# Patient Record
Sex: Male | Born: 1990 | Race: Black or African American | Hispanic: No | Marital: Single | Smoking: Current some day smoker
Health system: Southern US, Community
[De-identification: ages and names within clinical notes are randomized; demographics above are authoritative.]

## PROBLEM LIST (undated history)

## (undated) DIAGNOSIS — U071 COVID-19: Secondary | ICD-10-CM

## (undated) DIAGNOSIS — K649 Unspecified hemorrhoids: Secondary | ICD-10-CM

## (undated) DIAGNOSIS — J45909 Unspecified asthma, uncomplicated: Secondary | ICD-10-CM

## (undated) DIAGNOSIS — F419 Anxiety disorder, unspecified: Secondary | ICD-10-CM

---
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
## 2014-02-16 ENCOUNTER — Emergency Department (HOSPITAL_COMMUNITY): Payer: No Typology Code available for payment source

## 2014-02-16 ENCOUNTER — Encounter (HOSPITAL_COMMUNITY): Payer: Self-pay | Admitting: Family Medicine

## 2014-02-16 ENCOUNTER — Emergency Department (HOSPITAL_COMMUNITY)
Admission: EM | Admit: 2014-02-16 | Discharge: 2014-02-16 | Disposition: A | Discharge status: Home / Self Care | Payer: No Typology Code available for payment source | Attending: Emergency Medicine | Admitting: Emergency Medicine

## 2014-02-16 DIAGNOSIS — Y9389 Activity, other specified: Secondary | ICD-10-CM | POA: Insufficient documentation

## 2014-02-16 DIAGNOSIS — Z720 Tobacco use: Secondary | ICD-10-CM | POA: Insufficient documentation

## 2014-02-16 DIAGNOSIS — Z23 Encounter for immunization: Secondary | ICD-10-CM | POA: Insufficient documentation

## 2014-02-16 DIAGNOSIS — S61219A Laceration without foreign body of unspecified finger without damage to nail, initial encounter: Secondary | ICD-10-CM

## 2014-02-16 DIAGNOSIS — Y998 Other external cause status: Secondary | ICD-10-CM | POA: Insufficient documentation

## 2014-02-16 DIAGNOSIS — M25531 Pain in right wrist: Secondary | ICD-10-CM

## 2014-02-16 DIAGNOSIS — J45909 Unspecified asthma, uncomplicated: Secondary | ICD-10-CM | POA: Insufficient documentation

## 2014-02-16 DIAGNOSIS — S61212A Laceration without foreign body of right middle finger without damage to nail, initial encounter: Secondary | ICD-10-CM | POA: Insufficient documentation

## 2014-02-16 DIAGNOSIS — Y92410 Unspecified street and highway as the place of occurrence of the external cause: Secondary | ICD-10-CM | POA: Insufficient documentation

## 2014-02-16 HISTORY — DX: Unspecified asthma, uncomplicated: J45.909

## 2014-02-16 MED ORDER — LIDOCAINE HCL (PF) 1 % IJ SOLN
5.0000 mL | Freq: Once | INTRAMUSCULAR | Status: AC
  Administered 2014-02-16: 5 mL
  Filled 2014-02-16: qty 5

## 2014-02-16 MED ORDER — TETANUS-DIPHTH-ACELL PERTUSSIS 5-2.5-18.5 LF-MCG/0.5 IM SUSP
0.5000 mL | Freq: Once | INTRAMUSCULAR | Status: AC
  Administered 2014-02-16: 0.5 mL via INTRAMUSCULAR
  Filled 2014-02-16: qty 0.5

## 2014-02-16 MED ORDER — KETOROLAC TROMETHAMINE 10 MG PO TABS: 10.0000 mg | ORAL_TABLET | Freq: Four times a day (QID) | ORAL | Status: DC | PRN
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
## 2014-02-16 NOTE — Discharge Instructions (Signed)
Return to the emergency room with worsening of symptoms, new symptoms or with symptoms that are concerning , especially severe worsening of headache, visual or speech changes, weakness in face, arms or legs. RICE: Rest, Ice (three cycles of 20 mins on, 20mins off at least twice a day), compression/brace, elevation. Heating pad works well for back pain. Ibuprofen 400mg  (2 tablets 200mg ) every 5-6 hours for 3-5 days and then as needed for pain. Follow up with PCP/orthopedist if symptoms worsen or are persistent.  Keep wound dry and do not remove dressing for 24 hours if possible. After that, wash gently morning and night (every 12 hours) with soap and water. Use a topical antibiotic ointment and cover with a bandaid or gauze.    Do NOT use rubbing alcohol or hydrogen peroxide, do not soak the area   Present to your primary care doctor or the urgent care of your choice, or the ED for suture removal in 7-10 days.   Every attempt was made to remove foreign body (contaminants) from the wound.  However, there is always a chance that some may remain in the wound. This can  increase your risk of infection.   If you see signs of infection (warmth, redness, tenderness, pus, sharp increase in pain, fever, red streaking in the skin) immediately return to the emergency department.   After the wound heals fully, apply sunscreen for 6-12 months to minimize scarring.

## 2014-02-16 NOTE — ED Notes (Signed)
Declined W/C at D/C and was escorted to lobby by RN. 

## 2016-11-07 ENCOUNTER — Encounter: Payer: Self-pay | Admitting: Emergency Medicine

## 2016-11-07 ENCOUNTER — Emergency Department
Admission: EM | Admit: 2016-11-07 | Discharge: 2016-11-07 | Disposition: A | Discharge status: Home / Self Care | Payer: Self-pay | Attending: Emergency Medicine | Admitting: Emergency Medicine

## 2016-11-07 ENCOUNTER — Emergency Department: Payer: Self-pay

## 2016-11-07 DIAGNOSIS — Y999 Unspecified external cause status: Secondary | ICD-10-CM | POA: Insufficient documentation

## 2016-11-07 DIAGNOSIS — Y93A9 Activity, other involving cardiorespiratory exercise: Secondary | ICD-10-CM | POA: Insufficient documentation

## 2016-11-07 DIAGNOSIS — Y92838 Other recreation area as the place of occurrence of the external cause: Secondary | ICD-10-CM | POA: Insufficient documentation

## 2016-11-07 DIAGNOSIS — X509XXA Other and unspecified overexertion or strenuous movements or postures, initial encounter: Secondary | ICD-10-CM | POA: Insufficient documentation

## 2016-11-07 DIAGNOSIS — S83422A Sprain of lateral collateral ligament of left knee, initial encounter: Secondary | ICD-10-CM | POA: Insufficient documentation

## 2016-11-07 DIAGNOSIS — F17210 Nicotine dependence, cigarettes, uncomplicated: Secondary | ICD-10-CM | POA: Insufficient documentation

## 2016-11-07 DIAGNOSIS — J45909 Unspecified asthma, uncomplicated: Secondary | ICD-10-CM | POA: Insufficient documentation

## 2016-11-07 NOTE — Discharge Instructions (Signed)
Your exam and x-ray are negative at this time. Your exam is consistent with a knee sprain. Wear the knee wrap as needed for support. Rest with the foot elevated when seated. Take OTC ibuprofen or naproxen for pain relief. Follow-up with ortho as needed.

## 2016-11-07 NOTE — ED Notes (Signed)
Ace wrap applied to left knee. Pt given instructions on loosening should it get to tight due to swelling. Pt stated at this time it feels good and not to tight.

## 2016-11-07 NOTE — ED Notes (Signed)
See triage note  Presents with pain to left knee this am  States he did not fall onto knee but developed pain after sparing at the gym  No deformity noted

## 2016-11-07 NOTE — ED Provider Notes (Signed)
[facility] Emergency Department Provider Note ____________________________________________  Time seen: 1012  I have reviewed the triage vital signs and the nursing notes.  HISTORY  Chief Complaint  Knee Pain  HPI Randall Phelps is a 26 y.o. male presents to the ED for evaluation of left knee pain after injuring himself during of sparring exercise at the gym.He reports sustaining what he describes as a varus stress to the left knee. He denies any immediate swelling and pain, he did note some lateral knee joint pain as well as some limping after the injury. Has applied some ice but no other interventions have been provided. He denies any history of chronic or ongoing knee pain. He presents today for evaluation of a knee sprain on the left.  Past Medical History:  Diagnosis Date  . Asthma     There are no active problems to display for this patient.   History reviewed. No pertinent surgical history.  Prior to Admission medications   Medication Sig Start Date End Date Taking? Authorizing Provider  ketorolac (TORADOL) 10 MG tablet Take 1 tablet (10 mg total) by mouth every 6 (six) hours as needed. 02/16/14   Oswaldo Conroyreech, Victoria, PA-C    Allergies Patient has no known allergies.  No family history on file.  Social History Social History  Substance Use Topics  . Smoking status: Current Every Day Smoker  . Smokeless tobacco: Never Used  . Alcohol use Yes    Review of Systems  Constitutional: Negative for fever. Cardiovascular: Negative for chest pain. Respiratory: Negative for shortness of breath. Musculoskeletal: Negative for back pain. Left knee pain as above. Neurological: Negative for headaches, focal weakness or numbness. ____________________________________________  PHYSICAL EXAM:  VITAL SIGNS: ED Triage Vitals  Enc Vitals Group     BP 11/07/16 0937 129/86     Pulse Rate 11/07/16 0937 86     Resp 11/07/16 0937 18     Temp 11/07/16 0937  98.3 F (36.8 C)     Temp Source 11/07/16 0937 Oral     SpO2 11/07/16 0937 99 %     Weight 11/07/16 0938 160 lb (72.6 kg)     Height 11/07/16 0938 6\' 3"  (1.905 m)     Head Circumference --      Peak Flow --      Pain Score 11/07/16 0933 8     Pain Loc --      Pain Edu? --      Excl. in GC? --     Constitutional: Alert and oriented. Well appearing and in no distress. Head: Normocephalic and atraumatic. Cardiovascular:  Normal distal pulses. Respiratory: Normal respiratory effort.  Musculoskeletal: Left knee without any obvious deformity, dislocation, or joint effusion. Patient is to palpation to the lateral knee joint at the collateral ligament. There is increased pain with valgus stress to the joint. Negative anterior posterior drawer. No medical popliteal space fullness or tenderness. The patella tracks normally without ballottement or laxity. Nontender with normal range of motion in all extremities.  Neurologic:  Antalgic gait without ataxia. Normal speech and language. No gross focal neurologic deficits are appreciated. Skin:  Skin is warm, dry and intact. No rash noted. ___________________________________________   RADIOLOGY  Left Knee  IMPRESSION: No acute finding by plain radiography  I, Lovell Nuttall, Charlesetta IvoryJenise V Bacon, personally viewed and evaluated these images (plain radiographs) as part of my medical decision making, as well as reviewing the written report by the radiologist. ____________________________________________  PROCEDURES  Ace bandage  ____________________________________________  INITIAL IMPRESSION / ASSESSMENT AND PLAN / ED COURSE  Patient ED evaluation of a left knee lateral collateral ligament sprain. He has an overall normal knee exam without significant signs of internal derangement. No effusion is noted. He will be placed in Ace bandage and given knee sprain instructions. He should ice, rest, elevate the knee as needed. He may dose over-the-counter  anti-inflammatories for pain and inflammation relief. He is given name for the increased symptoms persist longer than expected. Return precautions are reviewed. ____________________________________________  FINAL CLINICAL IMPRESSION(S) / ED DIAGNOSES  Final diagnoses:  Sprain of lateral collateral ligament of left knee, initial encounter      Lissa Hoard, PA-C 11/07/16 1853    Phineas Semen, MD 11/08/16 1501

## 2016-11-07 NOTE — ED Triage Notes (Signed)
Patient presents to the ED with left knee pain that began today after injuring his knee "sparing" at the gym.  Patient is in no obvious distress at this time.

## 2017-12-19 ENCOUNTER — Emergency Department (HOSPITAL_COMMUNITY): Admission: EM | Admit: 2017-12-19 | Discharge: 2017-12-19 | Discharge status: Absent without leave | Payer: Self-pay

## 2018-01-15 ENCOUNTER — Encounter: Payer: Self-pay | Admitting: Emergency Medicine

## 2018-01-15 ENCOUNTER — Emergency Department
Admission: EM | Admit: 2018-01-15 | Discharge: 2018-01-15 | Disposition: A | Discharge status: Home / Self Care | Payer: Self-pay | Attending: Emergency Medicine | Admitting: Emergency Medicine

## 2018-01-15 ENCOUNTER — Other Ambulatory Visit: Payer: Self-pay

## 2018-01-15 DIAGNOSIS — F17200 Nicotine dependence, unspecified, uncomplicated: Secondary | ICD-10-CM | POA: Insufficient documentation

## 2018-01-15 DIAGNOSIS — Y9289 Other specified places as the place of occurrence of the external cause: Secondary | ICD-10-CM | POA: Insufficient documentation

## 2018-01-15 DIAGNOSIS — Y990 Civilian activity done for income or pay: Secondary | ICD-10-CM | POA: Insufficient documentation

## 2018-01-15 DIAGNOSIS — S29012A Strain of muscle and tendon of back wall of thorax, initial encounter: Secondary | ICD-10-CM | POA: Insufficient documentation

## 2018-01-15 DIAGNOSIS — S239XXA Sprain of unspecified parts of thorax, initial encounter: Secondary | ICD-10-CM

## 2018-01-15 DIAGNOSIS — J45909 Unspecified asthma, uncomplicated: Secondary | ICD-10-CM | POA: Insufficient documentation

## 2018-01-15 DIAGNOSIS — Y33XXXA Other specified events, undetermined intent, initial encounter: Secondary | ICD-10-CM | POA: Insufficient documentation

## 2018-01-15 DIAGNOSIS — Y939 Activity, unspecified: Secondary | ICD-10-CM | POA: Insufficient documentation

## 2018-01-15 MED ORDER — CYCLOBENZAPRINE HCL 5 MG PO TABS: 5.0000 mg | ORAL_TABLET | Freq: Three times a day (TID) | ORAL | 0 refills | Status: DC | PRN

## 2018-01-15 MED ORDER — NAPROXEN 500 MG PO TABS: 500.0000 mg | ORAL_TABLET | Freq: Two times a day (BID) | ORAL | 0 refills | Status: AC

## 2018-01-15 NOTE — Discharge Instructions (Addendum)
Your exam is consistent with muscle strain of the upper back. Take the prescription meds as directed. Apply ice, moist heat, and/or BioFreeze for muscle pain relief. Follow-up with [facility] as needed.

## 2018-01-15 NOTE — ED Notes (Signed)
See triage note.Presents with upper back which started today  Denies any injury

## 2018-01-15 NOTE — ED Triage Notes (Signed)
Says upper back pain ssarted about 1230p today while at work.  Says he did not injury

## 2018-01-15 NOTE — ED Provider Notes (Signed)
[facility] Emergency Department Provider Note ____________________________________________  Time seen: 1542  I have reviewed the triage vital signs and the nursing notes.  HISTORY  Chief Complaint  Back Pain  HPI Randall Phelps is a 27 y.o. male who presents to the ED for evaluation of upper back pain, with onset this afternoon. He was at work when the pain began, at about 12:30 pm. He denies any injury, trauma, fall, chest pain, SOB, distal paresthesias. or diaphoresis. He denies any job-related injury, but notes his work activities include a lot of lifting overhead. He has not tried any alleviating measures. His pain is not influenced by UE ROM or breathing.   Past Medical History:  Diagnosis Date  . Asthma    There are no active problems to display for this patient.  History reviewed. No pertinent surgical history.  Prior to Admission medications   Medication Sig Start Date End Date Taking? Authorizing Provider  cyclobenzaprine (FLEXERIL) 5 MG tablet Take 1 tablet (5 mg total) by mouth 3 (three) times daily as needed for muscle spasms. 01/15/18   Eathen Budreau, Charlesetta Ivory, PA-C  naproxen (NAPROSYN) 500 MG tablet Take 1 tablet (500 mg total) by mouth 2 (two) times daily with a meal for 15 days. 01/15/18 01/30/18  Callaghan Laverdure, Charlesetta Ivory, PA-C   Allergies Patient has no known allergies.  No family history on file.  Social History Social History   Tobacco Use  . Smoking status: Current Every Day Smoker  . Smokeless tobacco: Never Used  Substance Use Topics  . Alcohol use: Yes  . Drug use: Not on file    Review of Systems  Constitutional: Negative for fever. Cardiovascular: Negative for chest pain. Respiratory: Negative for shortness of breath. Gastrointestinal: Negative for abdominal pain, vomiting and diarrhea. Genitourinary: Negative for dysuria. Musculoskeletal: Positive for upper back pain. Skin: Negative for rash. Neurological: Negative  for headaches, focal weakness or numbness. ____________________________________________  PHYSICAL EXAM:  VITAL SIGNS: ED Triage Vitals [01/15/18 1515]  Enc Vitals Group     BP 127/74     Pulse Rate 85     Resp 14     Temp 98.4 F (36.9 C)     Temp Source Oral     SpO2 98 %     Weight 160 lb (72.6 kg)     Height 6\' 3"  (1.905 m)     Head Circumference      Peak Flow      Pain Score 6     Pain Loc      Pain Edu?      Excl. in GC?     Constitutional: Alert and oriented. Well appearing and in no distress. Head: Normocephalic and atraumatic. Eyes: Conjunctivae are normal. Normal extraocular movements Neck: Supple. Normal ROM without crepitus.  Cardiovascular: Normal rate, regular rhythm. Normal distal pulses. Respiratory: Normal respiratory effort. No wheezes/rales/rhonchi. Musculoskeletal: Normal spinal alignment without midline tenderness, spasm, deformity, or step-off.  Patient is only tender to palpation to the bilateral upper trapezius musculature in the rhomboids bilaterally.  Rotator cuff testing is intact and without deficit bilaterally.  Normal composite fists noted.  Nontender with normal range of motion in all extremities.  Neurologic: Nerves II through XII grossly intact.  Normal UE DTRs laterally.  Normal gait without ataxia. Normal speech and language. No gross focal neurologic deficits are appreciated. Skin:  Skin is warm, dry and intact. No rash noted. ____________________________________________  INITIAL IMPRESSION / ASSESSMENT AND PLAN / ED COURSE  Patient with ED evaluation of bilateral thoracic muscle strain.  Exam is overall reassuring as it shows no acute traumatic process or any respiratory process.  Patient's symptoms and pain are reproducible with palpation across the upper back musculature.  Patient is discharged with instructions to prescription medications as prescribed.  He will discharge with a prescription for naproxen as well as cyclobenzaprine.  He is  also encouraged to use over-the-counter topical muscle relief analgesics like Biofreeze.  He will return to work as scheduled.  Referral to [facility] is also provided for routine medical care and follow-up. ____________________________________________  FINAL CLINICAL IMPRESSION(S) / ED DIAGNOSES  Final diagnoses:  Thoracic back sprain, initial encounter      Lissa Hoard, PA-C 01/15/18 1620    Minna Antis, MD 01/15/18 2024

## 2018-06-10 IMAGING — CR DG KNEE COMPLETE 4+V*L*
4 series · 4 of 4 positions shown · non-contrast
Comparison: 09/29/2005

CLINICAL DATA: Knee injury yesterday, persistent pain

EXAM:
LEFT KNEE - COMPLETE 4+ VIEW

[knee ap]
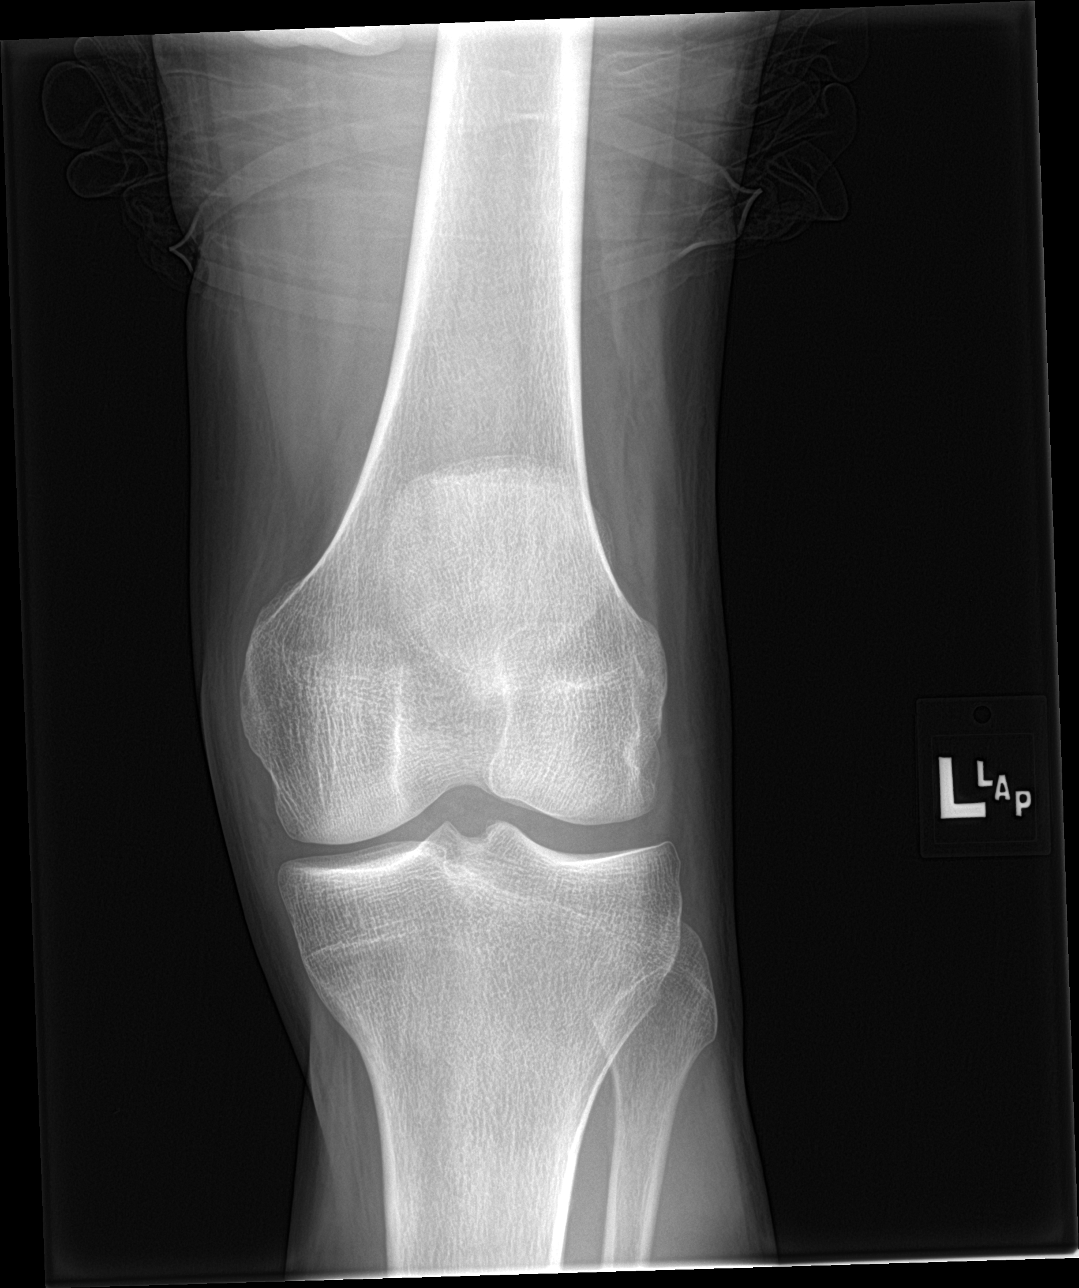

[knee obl (1 of 2)]
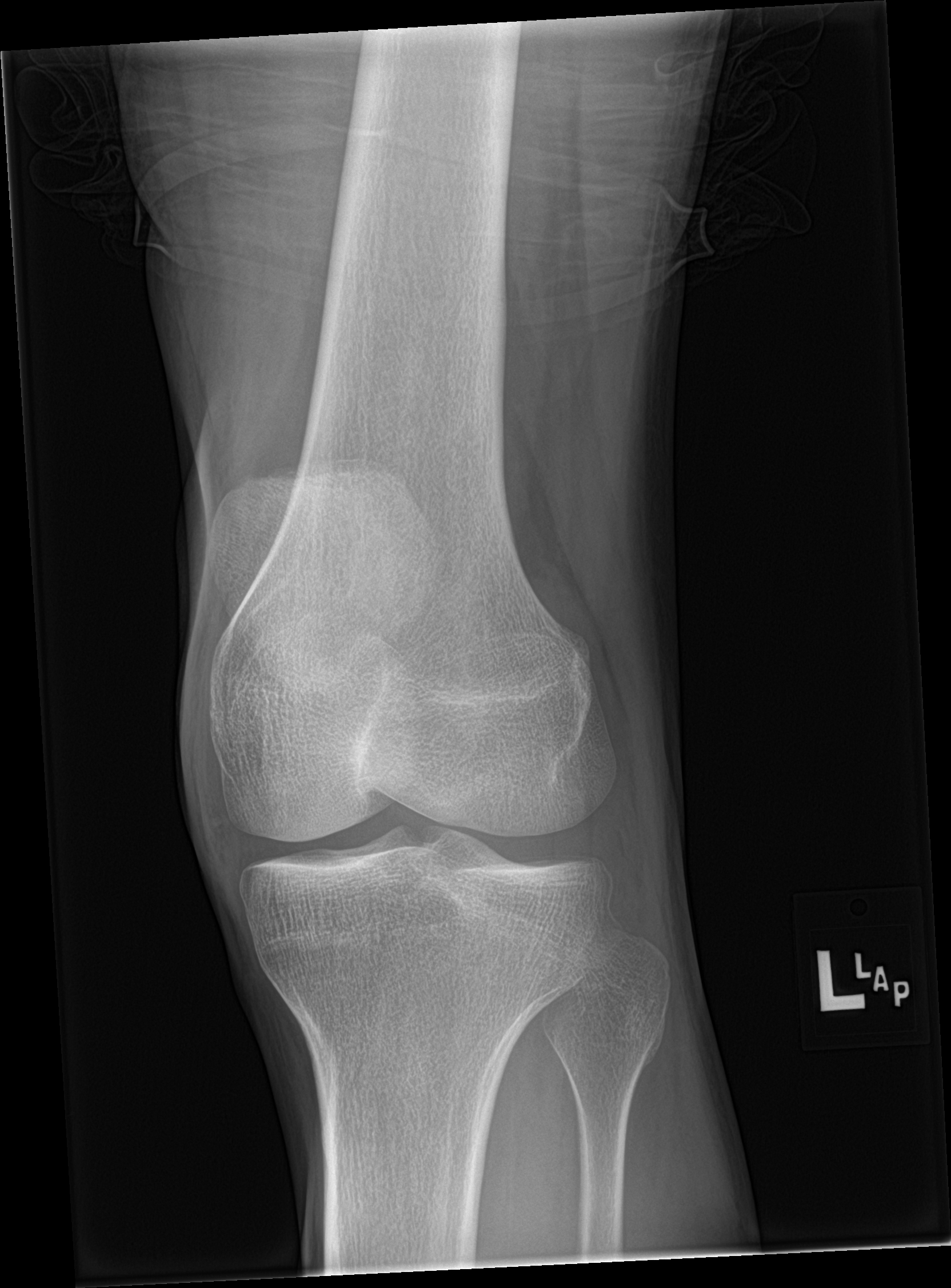

[knee obl (2 of 2)]
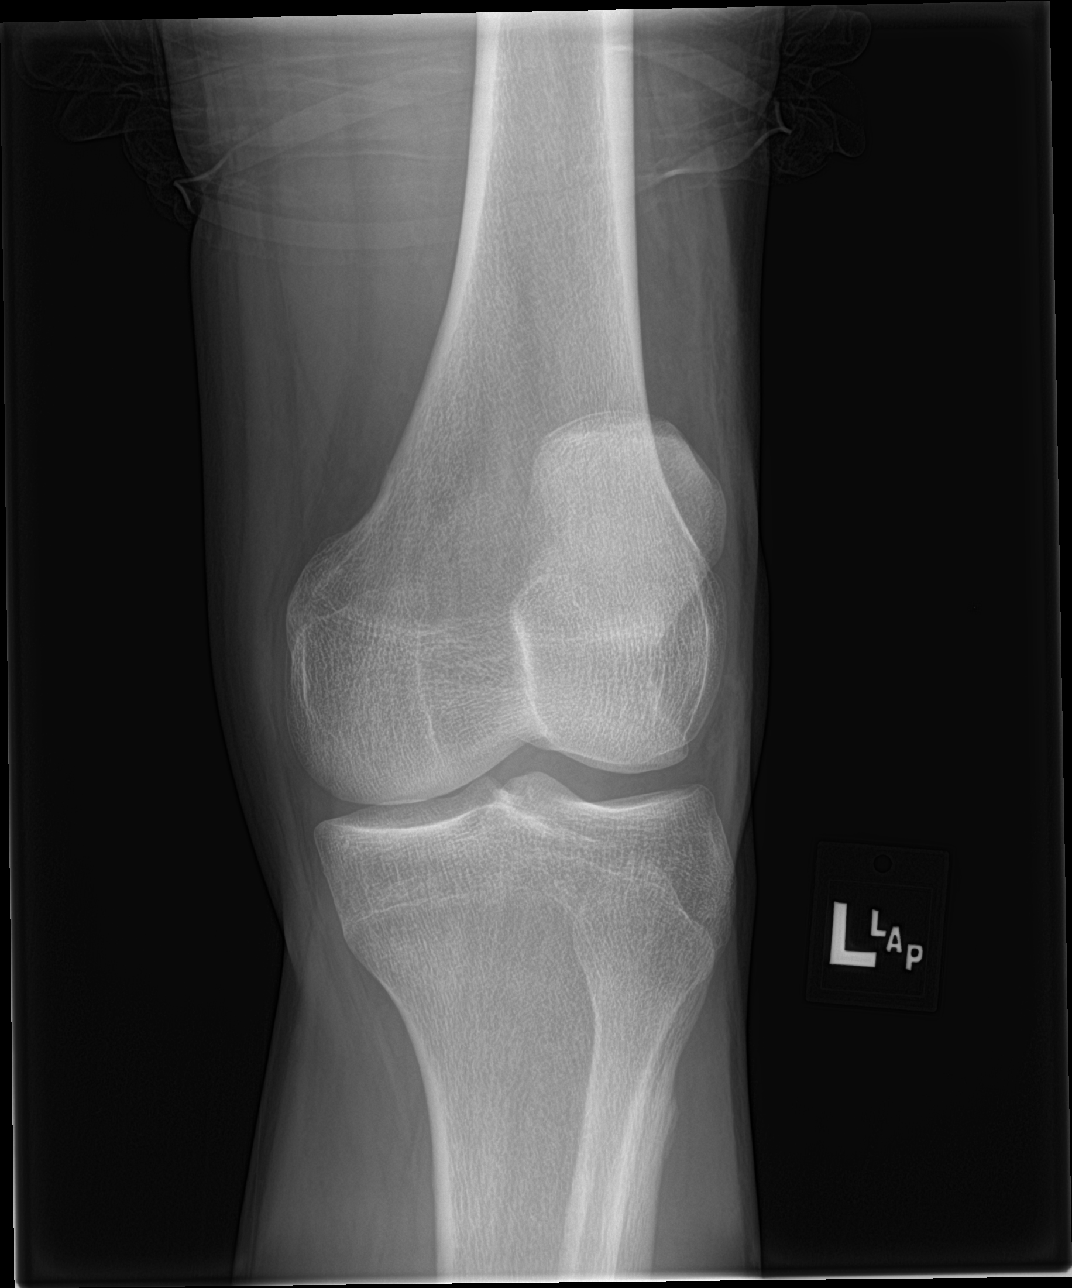

[knee lat]
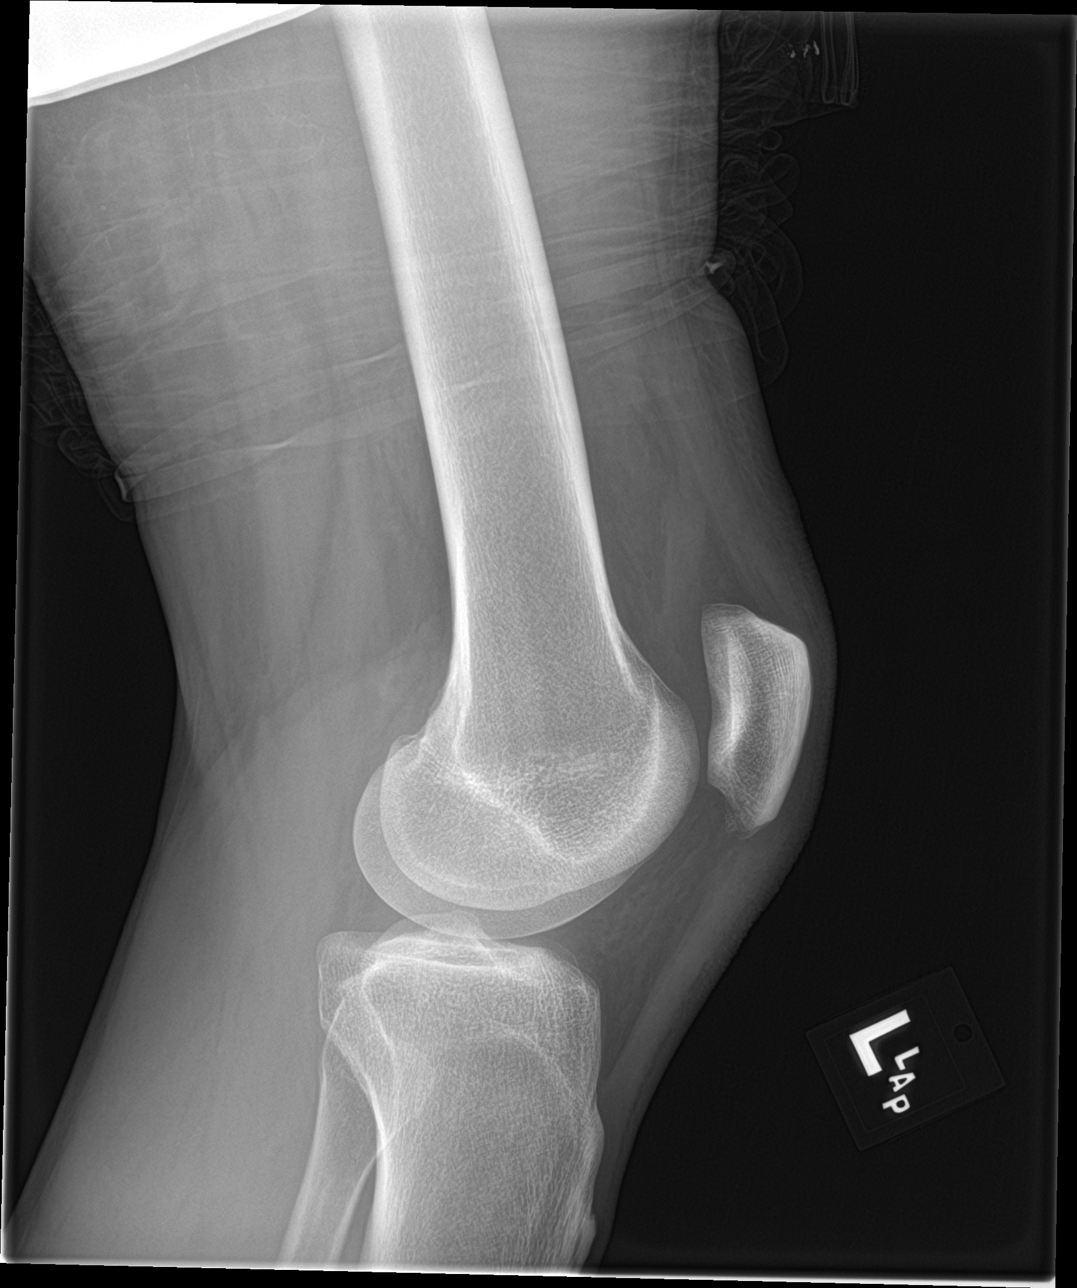

[4 of 4 positions shown; findings below may reference images not displayed]

FINDINGS: No evidence of fracture, dislocation, or joint effusion. No evidence
of arthropathy or other focal bone abnormality. Soft tissues are
unremarkable.
IMPRESSION: No acute finding by plain radiography

## 2018-12-10 ENCOUNTER — Other Ambulatory Visit: Payer: Self-pay

## 2018-12-10 DIAGNOSIS — Z20822 Contact with and (suspected) exposure to covid-19: Secondary | ICD-10-CM

## 2018-12-11 LAB — NOVEL CORONAVIRUS, NAA: SARS-CoV-2, NAA: NOT DETECTED

## 2020-05-14 ENCOUNTER — Other Ambulatory Visit: Payer: Self-pay

## 2020-05-14 ENCOUNTER — Emergency Department
Admission: EM | Admit: 2020-05-14 | Discharge: 2020-05-14 | Disposition: A | Discharge status: Home / Self Care | Payer: Self-pay | Attending: Emergency Medicine | Admitting: Emergency Medicine

## 2020-05-14 DIAGNOSIS — J45909 Unspecified asthma, uncomplicated: Secondary | ICD-10-CM | POA: Insufficient documentation

## 2020-05-14 DIAGNOSIS — F17200 Nicotine dependence, unspecified, uncomplicated: Secondary | ICD-10-CM | POA: Insufficient documentation

## 2020-05-14 DIAGNOSIS — K643 Fourth degree hemorrhoids: Secondary | ICD-10-CM | POA: Insufficient documentation

## 2020-05-14 MED ORDER — HYDROCODONE-ACETAMINOPHEN 5-325 MG PO TABS: 1.0000 | ORAL_TABLET | ORAL | 0 refills | Status: DC | PRN

## 2020-05-14 MED ORDER — HYDROCODONE-ACETAMINOPHEN 5-325 MG PO TABS
1.0000 | ORAL_TABLET | Freq: Once | ORAL | Status: AC
  Administered 2020-05-14: 1 via ORAL
  Filled 2020-05-14: qty 1

## 2020-05-14 MED ORDER — DOCUSATE SODIUM 100 MG PO CAPS: 100.0000 mg | ORAL_CAPSULE | Freq: Every day | ORAL | 0 refills | Status: DC | PRN

## 2020-05-14 MED ORDER — LIDOCAINE-HYDROCORTISONE ACE 2.8-0.55 % RE GEL
1.0000 | Freq: Three times a day (TID) | RECTAL | 1 refills | Status: DC | PRN
Start: 2020-05-14 — End: 2020-05-29

## 2020-05-14 NOTE — ED Notes (Signed)
Pt to ED c/o painful hemorrhoids for 1 week, today being very painful, rating pain at 6/10. States has had hemorrhoids for years. Usually has to push them back in when has BM. LBM was today, normal, but had gone 3-4 days with no BM. Usually has BM 3-4 times/day. Denies rectal bleeding.

## 2020-05-14 NOTE — ED Provider Notes (Signed)
[facility] Emergency Department Provider Note  ____________________________________________  Time seen: Approximately 7:34 PM  I have reviewed the triage vital signs and the nursing notes.   HISTORY  Chief Complaint Hemorrhoids    HPI Randall Phelps is a 30 y.o. male who presents the emergency department complaining of hemorrhoid.  Patient states that he has a known history of hemorrhoids, has had the same hemorrhoid for several years.  Patient states that it is now protruding out of his rectum.  No active bleeding.  Patient is complaining of pain.  He is used Preparation H at home.  No other complaints.  He denies any constipation or abdominal pain.         Past Medical History:  Diagnosis Date  . Asthma     There are no problems to display for this patient.   History reviewed. No pertinent surgical history.  Prior to Admission medications   Medication Sig Start Date End Date Taking? Authorizing Provider  docusate sodium (COLACE) 100 MG capsule Take 1 capsule (100 mg total) by mouth daily as needed. 05/14/20 05/14/21 Yes Allena Pietila, Delorise Royals, PA-C  HYDROcodone-acetaminophen (NORCO/VICODIN) 5-325 MG tablet Take 1 tablet by mouth every 4 (four) hours as needed for moderate pain. 05/14/20  Yes Lamarr Feenstra, Delorise Royals, PA-C  Lidocaine-Hydrocortisone Ace 2.8-0.55 % GEL Place 1 application rectally 3 (three) times daily as needed (hemorrhoid pain). 05/14/20  Yes Jacquel Redditt, Delorise Royals, PA-C  cyclobenzaprine (FLEXERIL) 5 MG tablet Take 1 tablet (5 mg total) by mouth 3 (three) times daily as needed for muscle spasms. 01/15/18   Menshew, Charlesetta Ivory, PA-C    Allergies Patient has no known allergies.  No family history on file.  Social History Social History   Tobacco Use  . Smoking status: Current Every Day Smoker  . Smokeless tobacco: Never Used  Substance Use Topics  . Alcohol use: Yes     Review of Systems  Constitutional: No  fever/chills Eyes: No visual changes. No discharge ENT: No upper respiratory complaints. Cardiovascular: no chest pain. Respiratory: no cough. No SOB. Gastrointestinal: No abdominal pain.  No nausea, no vomiting.  No diarrhea.  No constipation. Musculoskeletal: Negative for musculoskeletal pain. Skin: Negative for rash, abrasions, lacerations, ecchymosis. Neurological: Negative for headaches, focal weakness or numbness.  10 System ROS otherwise negative.  ____________________________________________   PHYSICAL EXAM:  VITAL SIGNS: ED Triage Vitals  Enc Vitals Group     BP 05/14/20 1753 (!) 148/106     Pulse Rate 05/14/20 1753 67     Resp 05/14/20 1753 18     Temp 05/14/20 1753 98.4 F (36.9 C)     Temp Source 05/14/20 1753 Oral     SpO2 05/14/20 1753 99 %     Weight --      Height --      Head Circumference --      Peak Flow --      Pain Score 05/14/20 1753 6     Pain Loc --      Pain Edu? --      Excl. in GC? --      Constitutional: Alert and oriented. Well appearing and in no acute distress. Eyes: Conjunctivae are normal. PERRL. EOMI. Head: Atraumatic. ENT:      Ears:       Nose: No congestion/rhinnorhea.      Mouth/Throat: Mucous membranes are moist.  Neck: No stridor.    Cardiovascular: Normal rate, regular rhythm. Normal S1 and S2.  Good peripheral  circulation. Respiratory: Normal respiratory effort without tachypnea or retractions. Lungs CTAB. Good air entry to the bases with no decreased or absent breath sounds. Gastrointestinal: Bowel sounds 4 quadrants. Soft and nontender to palpation. No guarding or rigidity. No palpable masses. No distention. No CVA tenderness.  Rectal exam reveals protruding hemorrhoid.  No evidence of active bleeding. Musculoskeletal: Full range of motion to all extremities. No gross deformities appreciated. Neurologic:  Normal speech and language. No gross focal neurologic deficits are appreciated.  Skin:  Skin is warm, dry and intact.  No rash noted. Psychiatric: Mood and affect are normal. Speech and behavior are normal. Patient exhibits appropriate insight and judgement.   ____________________________________________   LABS (all labs ordered are listed, but only abnormal results are displayed)  Labs Reviewed - No data to display ____________________________________________  EKG   ____________________________________________  RADIOLOGY   No results found.  ____________________________________________    PROCEDURES  Procedure(s) performed:    Procedures    Medications  HYDROcodone-acetaminophen (NORCO/VICODIN) 5-325 MG per tablet 1 tablet (has no administration in time range)     ____________________________________________   INITIAL IMPRESSION / ASSESSMENT AND PLAN / ED COURSE  Pertinent labs & imaging results that were available during my care of the patient were reviewed by me and considered in my medical decision making (see chart for details).  Review of the [place] CSRS was performed in accordance of the [facility] prior to dispensing any controlled drugs.           Patient's diagnosis is consistent with grade 4 hemorrhoid.  Patient presented to emergency department with protruding hemorrhoid.  History of same.  No active bleeding.  Findings were consistent with external hemorrhoid at this time.  This was unable to be retrieved reduced.  At this time patient will be prescribed lidocaine hydrocortisone topical, stool softener, limited pain medication.  I have explained to the patient that pain medicine can cause constipation and to use very sparingly.  At this time patient needs to follow-up with general surgery.  Patient is agreeable with the plan. Patient is given ED precautions to return to the ED for any worsening or new symptoms.     ____________________________________________  FINAL CLINICAL IMPRESSION(S) / ED DIAGNOSES  Final diagnoses:  Grade IV hemorrhoids      NEW  MEDICATIONS STARTED DURING THIS VISIT:  ED Discharge Orders         Ordered    Lidocaine-Hydrocortisone Ace 2.8-0.55 % GEL  3 times daily PRN        05/14/20 1940    HYDROcodone-acetaminophen (NORCO/VICODIN) 5-325 MG tablet  Every 4 hours PRN        05/14/20 1940    docusate sodium (COLACE) 100 MG capsule  Daily PRN        05/14/20 1940              This chart was dictated using voice recognition software/Dragon. Despite best efforts to proofread, errors can occur which can change the meaning. Any change was purely unintentional.    Racheal Patches, PA-C 05/14/20 1942    Sharyn Creamer, MD 05/15/20 [phone]

## 2020-05-14 NOTE — ED Triage Notes (Signed)
Pt comes with c/o hemorrhoids. Pt states he has been dealing with this for over a week. Pt denies ay bleeding. Pt states he feels like it is hanging out more and is painful. Pt states no relief with OTC meds.

## 2020-05-25 ENCOUNTER — Ambulatory Visit: Payer: Self-pay | Admitting: Surgery

## 2020-05-25 NOTE — H&P (View-Only) (Signed)
Subjective:  CC: Grade IV hemorrhoids [K64.3]   HPI:  Randall Phelps is a 30 y.o. male who was referrred by Emergency Room for above. Symptoms were first noted several years ago. Most recent episode a few weeks ago.no bleeding. Pain is sharp and intermittent, confined to the periranal area, without radiation.  Associated with mucus discharge, exacerbated by constipation  Toilet habits: Chronic constipation issues, which recently has improved with fiber and water intake.     Past Medical History: none reported  Past Surgical History: none reported  Family History: reviewed and not relevant to CC  Social History:  reports that he has been smoking cigarettes. He does not have any smokeless tobacco history on file. He reports previous alcohol use. No history on file for drug use.  Current Medications: currently has no medications in their medication list.  Allergies:  Allergies as of 05/25/2020  . (No Known Allergies)    ROS:  A 15 point review of systems was performed and pertinent positives and negatives noted in HPI  Objective:     BP 121/78   Pulse 96   Ht 193 cm (6\' 4" ) Comment: per pt  Wt 70.3 kg (155 lb) Comment: per pt  BMI 18.87 kg/m   Constitutional :  alert, appears stated age, cooperative and no distress  Lymphatics/Throat::  no asymmetry, masses, or scars  Respiratory:  clear to auscultation bilaterally  Cardiovascular:  regular rate and rhythm  Gastrointestinal: soft, non-tender; bowel sounds normal; no masses,  no organomegaly.    Musculoskeletal: Steady gait and movement  Skin: Cool and moist  Psychiatric: Normal affect, non-agitated, not confused  Genital/Rectal: Chaperone present for exam.  Very large grade IV hemorrhoids in left and right side, no evidence of obvious thrombosis.  Moderate TTP, no overyling ulceration    LABS:  n/a   RADS n/a:  Assessment:     Grade IV hemorrhoids [K64.3] large, likely will not resolve on its own. size and  duration of symptoms warrant surgical removal in the operating room.  Plan:    1. Grade IV hemorrhoids [K64.3] Discussed risks/benefits/alternatives to surgery.  Alternatives include the options of observation, medical management.  Benefits include symptomatic relief.  I discussed  in detail and the complications related to the operation and the anesthesia, including bleeding, infection, recurrence, remote possibility of temporary or permanent fecal incontinence, poor/delayed wound healing, chronic pain, and additional procedures to address said risks. The risks of general anesthetic, if used, includes MI, CVA, sudden death or even reaction to anesthetic medications also discussed.   We also discussed typical post operative recovery which includes weeks to potentially months of anal pain, drainage, occasional bleeding, and sense of fecal urgency.    ED return precautions given for sudden increase in pain, bleeding, with possible accompanying fever, nausea, and/or vomiting.  The patient understands the risks, any and all questions were answered to the patient's satisfaction.  2. Patient has elected to proceed with surgical treatment. Procedure will be scheduled.  Written consent was obtained.

## 2020-05-25 NOTE — H&P (Signed)
Subjective:  CC: Grade IV hemorrhoids [K64.3]   HPI:  Randall Phelps is a 30 y.o. male who was referrred by Emergency Room for above. Symptoms were first noted several years ago. Most recent episode a few weeks ago.no bleeding. Pain is sharp and intermittent, confined to the periranal area, without radiation.  Associated with mucus discharge, exacerbated by constipation  Toilet habits: Chronic constipation issues, which recently has improved with fiber and water intake.     Past Medical History: none reported  Past Surgical History: none reported  Family History: reviewed and not relevant to CC  Social History:  reports that he has been smoking cigarettes. He does not have any smokeless tobacco history on file. He reports previous alcohol use. No history on file for drug use.  Current Medications: currently has no medications in their medication list.  Allergies:  Allergies as of 05/25/2020  . (No Known Allergies)    ROS:  A 15 point review of systems was performed and pertinent positives and negatives noted in HPI  Objective:     BP 121/78   Pulse 96   Ht 193 cm (6' 4") Comment: per pt  Wt 70.3 kg (155 lb) Comment: per pt  BMI 18.87 kg/m   Constitutional :  alert, appears stated age, cooperative and no distress  Lymphatics/Throat::  no asymmetry, masses, or scars  Respiratory:  clear to auscultation bilaterally  Cardiovascular:  regular rate and rhythm  Gastrointestinal: soft, non-tender; bowel sounds normal; no masses,  no organomegaly.    Musculoskeletal: Steady gait and movement  Skin: Cool and moist  Psychiatric: Normal affect, non-agitated, not confused  Genital/Rectal: Chaperone present for exam.  Very large grade IV hemorrhoids in left and right side, no evidence of obvious thrombosis.  Moderate TTP, no overyling ulceration    LABS:  n/a   RADS n/a:  Assessment:     Grade IV hemorrhoids [K64.3] large, likely will not resolve on its own. size and  duration of symptoms warrant surgical removal in the operating room.  Plan:    1. Grade IV hemorrhoids [K64.3] Discussed risks/benefits/alternatives to surgery.  Alternatives include the options of observation, medical management.  Benefits include symptomatic relief.  I discussed  in detail and the complications related to the operation and the anesthesia, including bleeding, infection, recurrence, remote possibility of temporary or permanent fecal incontinence, poor/delayed wound healing, chronic pain, and additional procedures to address said risks. The risks of general anesthetic, if used, includes MI, CVA, sudden death or even reaction to anesthetic medications also discussed.   We also discussed typical post operative recovery which includes weeks to potentially months of anal pain, drainage, occasional bleeding, and sense of fecal urgency.    ED return precautions given for sudden increase in pain, bleeding, with possible accompanying fever, nausea, and/or vomiting.  The patient understands the risks, any and all questions were answered to the patient's satisfaction.  2. Patient has elected to proceed with surgical treatment. Procedure will be scheduled.  Written consent was obtained.  

## 2020-05-27 ENCOUNTER — Other Ambulatory Visit
Admission: RE | Admit: 2020-05-27 | Discharge: 2020-05-27 | Disposition: A | Discharge status: Home / Self Care | Payer: Self-pay | Source: Ambulatory Visit | Attending: Surgery | Admitting: Surgery

## 2020-05-27 ENCOUNTER — Other Ambulatory Visit: Payer: Self-pay

## 2020-05-27 DIAGNOSIS — Z20822 Contact with and (suspected) exposure to covid-19: Secondary | ICD-10-CM | POA: Insufficient documentation

## 2020-05-27 DIAGNOSIS — Z01812 Encounter for preprocedural laboratory examination: Secondary | ICD-10-CM | POA: Insufficient documentation

## 2020-05-27 HISTORY — DX: COVID-19: U07.1

## 2020-05-27 HISTORY — DX: Unspecified hemorrhoids: K64.9

## 2020-05-27 HISTORY — DX: Anxiety disorder, unspecified: F41.9

## 2020-05-27 LAB — SARS CORONAVIRUS 2 (TAT 6-24 HRS): SARS Coronavirus 2: NEGATIVE

## 2020-05-27 NOTE — Patient Instructions (Signed)
Your procedure is scheduled on: 05/29/20 Report to the Registration Desk on the 1st floor of the [facility]. To find out your arrival time, please call [phone] between 1PM - 3PM on: 05/28/20  REMEMBER: Instructions that are not followed completely may result in serious medical risk, up to and including death; or upon the discretion of your surgeon and anesthesiologist your surgery may need to be rescheduled.  Do not eat food after midnight the night before surgery.  No gum chewing, lozengers or hard candies.  You may however, drink CLEAR liquids up to 2 hours before you are scheduled to arrive for your surgery. Do not drink anything within 2 hours of your scheduled arrival time.  Clear liquids include: - water  - apple juice without pulp - gatorade (not RED, PURPLE, OR BLUE) - black coffee or tea (Do NOT add milk or creamers to the coffee or tea) Do NOT drink anything that is not on this list.  TAKE THESE MEDICATIONS THE MORNING OF SURGERY WITH A SIP OF WATER: none   One week prior to surgery: Stop Anti-inflammatories (NSAIDS) such as Advil, Aleve, Ibuprofen, Motrin, Naproxen, Naprosyn and Aspirin based products such as Excedrin, Goodys Powder, BC Powder.  Stop ANY OVER THE COUNTER supplements until after surgery.  No Alcohol for 24 hours before or after surgery.  No Smoking including e-cigarettes for 24 hours prior to surgery.  No chewable tobacco products for at least 6 hours prior to surgery.  No nicotine patches on the day of surgery.  Do not use any "recreational" drugs for at least a week prior to your surgery.  Please be advised that the combination of cocaine and anesthesia may have negative outcomes, up to and including death. If you test positive for cocaine, your surgery will be cancelled.  On the morning of surgery brush your teeth with toothpaste and water, you may rinse your mouth with mouthwash if you wish. Do not swallow any toothpaste or  mouthwash.  Do not wear jewelry, make-up, hairpins, clips or nail polish.  Do not wear lotions, powders, or perfumes.   Do not shave body from the neck down 48 hours prior to surgery just in case you cut yourself which could leave a site for infection.  Also, freshly shaved skin may become irritated if using the CHG soap.  Contact lenses, hearing aids and dentures may not be worn into surgery.  Do not bring valuables to the hospital. [facility] is not responsible for any missing/lost belongings or valuables.   Use CHG Soap or wipes as directed on instruction sheet.  Notify your doctor if there is any change in your medical condition (cold, fever, infection).  Wear comfortable clothing (specific to your surgery type) to the hospital.  Plan for stool softeners for home use; pain medications have a tendency to cause constipation. You can also help prevent constipation by eating foods high in fiber such as fruits and vegetables and drinking plenty of fluids as your diet allows.  After surgery, you can help prevent lung complications by doing breathing exercises.  Take deep breaths and cough every 1-2 hours. Your doctor may order a device called an Incentive Spirometer to help you take deep breaths. When coughing or sneezing, hold a pillow firmly against your incision with both hands. This is called "splinting." Doing this helps protect your incision. It also decreases belly discomfort.  If you are being admitted to the hospital overnight, leave your suitcase in the car. After surgery it  may be brought to your room.  If you are being discharged the day of surgery, you will not be allowed to drive home. You will need a responsible adult (18 years or older) to drive you home and stay with you that night.   If you are taking public transportation, you will need to have a responsible adult (18 years or older) with you. Please confirm with your physician that it is acceptable to use public  transportation.   Please call the Pre-admissions Testing Dept. at [phone] if you have any questions about these instructions.  Surgery Visitation Policy:  Patients undergoing a surgery or procedure may have one family member or support person with them as long as that person is not COVID-19 positive or experiencing its symptoms.  That person may remain in the waiting area during the procedure.  Inpatient Visitation:    Visiting hours are 7 a.m. to 8 p.m. Inpatients will be allowed two visitors daily. The visitors may change each day during the patient's stay. No visitors under the age of 15. Any visitor under the age of 62 must be accompanied by an adult. The visitor must pass COVID-19 screenings, use hand sanitizer when entering and exiting the patient's room and wear a mask at all times, including in the patient's room. Patients must also wear a mask when staff or their visitor are in the room. Masking is required regardless of vaccination status.

## 2020-05-28 ENCOUNTER — Other Ambulatory Visit: Payer: Self-pay

## 2020-05-29 ENCOUNTER — Encounter
Admission: RE | Disposition: A | Discharge status: Home / Self Care | Payer: Self-pay | Source: Ambulatory Visit | Attending: Surgery

## 2020-05-29 ENCOUNTER — Encounter: Payer: Self-pay | Admitting: Surgery

## 2020-05-29 ENCOUNTER — Ambulatory Visit: Payer: Self-pay | Admitting: Registered Nurse

## 2020-05-29 ENCOUNTER — Ambulatory Visit
Admission: RE | Admit: 2020-05-29 | Discharge: 2020-05-29 | Disposition: A | Discharge status: Home / Self Care | Payer: Self-pay | Source: Ambulatory Visit | Attending: Surgery | Admitting: Surgery

## 2020-05-29 ENCOUNTER — Other Ambulatory Visit: Payer: Self-pay

## 2020-05-29 DIAGNOSIS — F17210 Nicotine dependence, cigarettes, uncomplicated: Secondary | ICD-10-CM | POA: Insufficient documentation

## 2020-05-29 DIAGNOSIS — K643 Fourth degree hemorrhoids: Secondary | ICD-10-CM | POA: Insufficient documentation

## 2020-05-29 HISTORY — PX: EVALUATION UNDER ANESTHESIA WITH HEMORRHOIDECTOMY: SHX5624

## 2020-05-29 SURGERY — EXAM UNDER ANESTHESIA WITH HEMORRHOIDECTOMY
Anesthesia: General | Site: Rectum

## 2020-05-29 MED ORDER — CHLORHEXIDINE GLUCONATE CLOTH 2 % EX PADS: 6.0000 | MEDICATED_PAD | Freq: Once | CUTANEOUS | Status: DC

## 2020-05-29 MED ORDER — GABAPENTIN 300 MG PO CAPS: 300.0000 mg | ORAL_CAPSULE | ORAL | Status: AC

## 2020-05-29 MED ORDER — MIDAZOLAM HCL 2 MG/2ML IJ SOLN
INTRAMUSCULAR | Status: AC
  Filled 2020-05-29: qty 2

## 2020-05-29 MED ORDER — FAMOTIDINE 20 MG PO TABS: 20.0000 mg | ORAL_TABLET | Freq: Once | ORAL | Status: AC

## 2020-05-29 MED ORDER — ACETAMINOPHEN 500 MG PO TABS: 1000.0000 mg | ORAL_TABLET | ORAL | Status: AC

## 2020-05-29 MED ORDER — DEXAMETHASONE SODIUM PHOSPHATE 10 MG/ML IJ SOLN
INTRAMUSCULAR | Status: DC | PRN
  Administered 2020-05-29: 10 mg via INTRAVENOUS

## 2020-05-29 MED ORDER — FENTANYL CITRATE (PF) 100 MCG/2ML IJ SOLN
INTRAMUSCULAR | Status: AC
  Filled 2020-05-29: qty 2

## 2020-05-29 MED ORDER — CHLORHEXIDINE GLUCONATE 0.12 % MT SOLN
OROMUCOSAL | Status: AC
  Administered 2020-05-29: 15 mL via OROMUCOSAL
  Filled 2020-05-29: qty 15

## 2020-05-29 MED ORDER — IBUPROFEN 800 MG PO TABS: 800.0000 mg | ORAL_TABLET | Freq: Three times a day (TID) | ORAL | 0 refills | Status: AC | PRN

## 2020-05-29 MED ORDER — BUPIVACAINE LIPOSOME 1.3 % IJ SUSP
INTRAMUSCULAR | Status: DC | PRN
  Administered 2020-05-29: 20 mL

## 2020-05-29 MED ORDER — PROPOFOL 500 MG/50ML IV EMUL
INTRAVENOUS | Status: AC
  Filled 2020-05-29: qty 50

## 2020-05-29 MED ORDER — PROPOFOL 10 MG/ML IV BOLUS
INTRAVENOUS | Status: DC | PRN
  Administered 2020-05-29 (×3): 30 mg via INTRAVENOUS
  Administered 2020-05-29: 50 mg via INTRAVENOUS

## 2020-05-29 MED ORDER — FENTANYL CITRATE (PF) 100 MCG/2ML IJ SOLN
INTRAMUSCULAR | Status: DC | PRN
  Administered 2020-05-29: 25 ug via INTRAVENOUS
  Administered 2020-05-29: 50 ug via INTRAVENOUS
  Administered 2020-05-29 (×3): 25 ug via INTRAVENOUS
  Administered 2020-05-29: 50 ug via INTRAVENOUS

## 2020-05-29 MED ORDER — PROPOFOL 10 MG/ML IV BOLUS
INTRAVENOUS | Status: AC
  Filled 2020-05-29: qty 20

## 2020-05-29 MED ORDER — DEXMEDETOMIDINE (PRECEDEX) IN NS 20 MCG/5ML (4 MCG/ML) IV SYRINGE
PREFILLED_SYRINGE | INTRAVENOUS | Status: AC
  Filled 2020-05-29: qty 5

## 2020-05-29 MED ORDER — DOCUSATE SODIUM 100 MG PO CAPS: 100.0000 mg | ORAL_CAPSULE | Freq: Two times a day (BID) | ORAL | 0 refills | Status: AC | PRN

## 2020-05-29 MED ORDER — ORAL CARE MOUTH RINSE: 15.0000 mL | Freq: Once | OROMUCOSAL | Status: AC

## 2020-05-29 MED ORDER — ONDANSETRON HCL 4 MG/2ML IJ SOLN
INTRAMUSCULAR | Status: DC | PRN
  Administered 2020-05-29: 4 mg via INTRAVENOUS

## 2020-05-29 MED ORDER — LACTATED RINGERS IV SOLN: INTRAVENOUS | Status: DC

## 2020-05-29 MED ORDER — BUPIVACAINE-EPINEPHRINE (PF) 0.5% -1:200000 IJ SOLN
INTRAMUSCULAR | Status: AC
  Filled 2020-05-29: qty 30

## 2020-05-29 MED ORDER — LIDOCAINE HCL (CARDIAC) PF 100 MG/5ML IV SOSY
PREFILLED_SYRINGE | INTRAVENOUS | Status: DC | PRN
  Administered 2020-05-29: 80 mg via INTRAVENOUS

## 2020-05-29 MED ORDER — BUPIVACAINE LIPOSOME 1.3 % IJ SUSP
INTRAMUSCULAR | Status: AC
  Filled 2020-05-29: qty 20

## 2020-05-29 MED ORDER — MIDAZOLAM HCL 2 MG/2ML IJ SOLN
INTRAMUSCULAR | Status: DC | PRN
  Administered 2020-05-29: 2 mg via INTRAVENOUS

## 2020-05-29 MED ORDER — HYDROCODONE-ACETAMINOPHEN 5-325 MG PO TABS: 1.0000 | ORAL_TABLET | Freq: Four times a day (QID) | ORAL | 0 refills | Status: AC | PRN

## 2020-05-29 MED ORDER — FAMOTIDINE 20 MG PO TABS
ORAL_TABLET | ORAL | Status: AC
  Administered 2020-05-29: 20 mg via ORAL
  Filled 2020-05-29: qty 1

## 2020-05-29 MED ORDER — LIDOCAINE 5 % EX OINT: 1.0000 "application " | TOPICAL_OINTMENT | Freq: Three times a day (TID) | CUTANEOUS | 0 refills | Status: AC | PRN

## 2020-05-29 MED ORDER — CELECOXIB 200 MG PO CAPS: 200.0000 mg | ORAL_CAPSULE | ORAL | Status: AC

## 2020-05-29 MED ORDER — ACETAMINOPHEN 500 MG PO TABS
ORAL_TABLET | ORAL | Status: AC
  Administered 2020-05-29: 1000 mg via ORAL
  Filled 2020-05-29: qty 2

## 2020-05-29 MED ORDER — CELECOXIB 200 MG PO CAPS
ORAL_CAPSULE | ORAL | Status: AC
  Administered 2020-05-29: 200 mg via ORAL
  Filled 2020-05-29: qty 1

## 2020-05-29 MED ORDER — PROPOFOL 500 MG/50ML IV EMUL
INTRAVENOUS | Status: DC | PRN
  Administered 2020-05-29: 100 ug/kg/min via INTRAVENOUS

## 2020-05-29 MED ORDER — CHLORHEXIDINE GLUCONATE 0.12 % MT SOLN: 15.0000 mL | Freq: Once | OROMUCOSAL | Status: AC

## 2020-05-29 MED ORDER — GLYCOPYRROLATE 0.2 MG/ML IJ SOLN
INTRAMUSCULAR | Status: DC | PRN
Start: 2020-05-29 — End: 2020-05-29
  Administered 2020-05-29: .2 mg via INTRAVENOUS

## 2020-05-29 MED ORDER — ACETAMINOPHEN 325 MG PO TABS: 650.0000 mg | ORAL_TABLET | Freq: Three times a day (TID) | ORAL | 0 refills | Status: AC | PRN

## 2020-05-29 MED ORDER — GABAPENTIN 300 MG PO CAPS
ORAL_CAPSULE | ORAL | Status: AC
  Administered 2020-05-29: 300 mg via ORAL
  Filled 2020-05-29: qty 1

## 2020-05-29 MED ORDER — BUPIVACAINE-EPINEPHRINE (PF) 0.5% -1:200000 IJ SOLN
INTRAMUSCULAR | Status: DC | PRN
  Administered 2020-05-29: 16 mL via PERINEURAL

## 2020-05-29 SURGICAL SUPPLY — 30 items
BLADE SURG 15 STRL LF DISP TIS (BLADE) ×1 IMPLANT
BLADE SURG 15 STRL SS (BLADE) ×2
BRIEF STRETCH FOR OB PAD XXL (UNDERPADS AND DIAPERS) ×2 IMPLANT
CANISTER SUCT 1200ML W/VALVE (MISCELLANEOUS) ×2 IMPLANT
COVER WAND RF STERILE (DRAPES) ×2 IMPLANT
DRAPE PERI LITHO V/GYN (MISCELLANEOUS) ×2 IMPLANT
DRAPE UNDER BUTTOCK W/FLU (DRAPES) ×2 IMPLANT
DRSG GAUZE FLUFF 36X18 (GAUZE/BANDAGES/DRESSINGS) ×2 IMPLANT
ELECT REM PT RETURN 9FT ADLT (ELECTROSURGICAL) ×2
ELECTRODE REM PT RTRN 9FT ADLT (ELECTROSURGICAL) ×1 IMPLANT
GLOVE SURG SYN 6.5 ES PF (GLOVE) ×2 IMPLANT
GLOVE SURG UNDER POLY LF SZ7 (GLOVE) ×2 IMPLANT
GOWN STRL REUS W/ TWL LRG LVL3 (GOWN DISPOSABLE) ×2 IMPLANT
GOWN STRL REUS W/TWL LRG LVL3 (GOWN DISPOSABLE) ×4
KIT TURNOVER CYSTO (KITS) ×2 IMPLANT
LABEL OR SOLS (LABEL) ×2 IMPLANT
MANIFOLD NEPTUNE II (INSTRUMENTS) ×2 IMPLANT
NEEDLE HYPO 22GX1.5 SAFETY (NEEDLE) ×2 IMPLANT
NEEDLE HYPO 25X1 1.5 SAFETY (NEEDLE) ×2 IMPLANT
NS IRRIG 500ML POUR BTL (IV SOLUTION) ×2 IMPLANT
PACK BASIN MINOR ARMC (MISCELLANEOUS) ×2 IMPLANT
PAD PREP 24X41 OB/GYN DISP (PERSONAL CARE ITEMS) ×2 IMPLANT
SHEARS HARMONIC 9CM CVD (BLADE) IMPLANT
SOL PREP PVP 2OZ (MISCELLANEOUS) ×2
SOLUTION PREP PVP 2OZ (MISCELLANEOUS) ×1 IMPLANT
SURGILUBE 2OZ TUBE FLIPTOP (MISCELLANEOUS) ×2 IMPLANT
SUT VIC AB 3-0 SH 27 (SUTURE) ×2
SUT VIC AB 3-0 SH 27X BRD (SUTURE) ×1 IMPLANT
SYR 10ML LL (SYRINGE) ×2 IMPLANT
SYR 20ML LL LF (SYRINGE) ×2 IMPLANT

## 2020-05-29 NOTE — Anesthesia Preprocedure Evaluation (Signed)
Anesthesia Evaluation  Patient identified by MRN, date of birth, ID band Patient awake    Reviewed: Allergy & Precautions, NPO status , Patient's Chart, lab work & pertinent test results  History of Anesthesia Complications Negative for: history of anesthetic complications  Airway Mallampati: II  TM Distance: >3 FB Neck ROM: Full    Dental no notable dental hx.    Pulmonary Current Smoker and Patient abstained from smoking.,    breath sounds clear to auscultation- rhonchi (-) wheezing      Cardiovascular Exercise Tolerance: Good (-) hypertension(-) CAD, (-) Past MI, (-) Cardiac Stents and (-) CABG  Rhythm:Regular Rate:Normal - Systolic murmurs and - Diastolic murmurs    Neuro/Psych neg Seizures Anxiety negative neurological ROS     GI/Hepatic negative GI ROS, Neg liver ROS,   Endo/Other  negative endocrine ROSneg diabetes  Renal/GU negative Renal ROS     Musculoskeletal negative musculoskeletal ROS (+)   Abdominal (+) - obese,   Peds  Hematology negative hematology ROS (+)   Anesthesia Other Findings Past Medical History: No date: Anxiety No date: Asthma     Comment:  childhood No date: COVID-19 No date: Hemorrhoids   Reproductive/Obstetrics                             Anesthesia Physical Anesthesia Plan  ASA: II  Anesthesia Plan: General   Post-op Pain Management:    Induction: Intravenous  PONV Risk Score and Plan: 0 and Propofol infusion  Airway Management Planned: Natural Airway  Additional Equipment:   Intra-op Plan:   Post-operative Plan:   Informed Consent: I have reviewed the patients History and Physical, chart, labs and discussed the procedure including the risks, benefits and alternatives for the proposed anesthesia with the patient or authorized representative who has indicated his/her understanding and acceptance.     Dental advisory given  Plan  Discussed with: CRNA and Anesthesiologist  Anesthesia Plan Comments:         Anesthesia Quick Evaluation

## 2020-05-29 NOTE — Interval H&P Note (Signed)
History and Physical Interval Note:  05/29/2020 12:29 PM  Randall Phelps  has presented today for surgery, with the diagnosis of Grade IV hemorrhoids K64.3.  The various methods of treatment have been discussed with the patient and family. After consideration of risks, benefits and other options for treatment, the patient has consented to  Procedure(s): EXAM UNDER ANESTHESIA WITH HEMORRHOIDECTOMY (N/A) as a surgical intervention.  The patient's history has been reviewed, patient examined, no change in status, stable for surgery.  I have reviewed the patient's chart and labs.  Questions were answered to the patient's satisfaction.     Araceli Arango Tonna Boehringer

## 2020-05-29 NOTE — Transfer of Care (Signed)
Immediate Anesthesia Transfer of Care Note  Patient: Randall Phelps  Procedure(s) Performed: EXAM UNDER ANESTHESIA WITH HEMORRHOIDECTOMY (N/A Rectum)  Patient Location: PACU  Anesthesia Type:General  Level of Consciousness: awake, drowsy and patient cooperative  Airway & Oxygen Therapy: Patient Spontanous Breathing  Post-op Assessment: Report given to RN, Post -op Vital signs reviewed and stable and Patient moving all extremities  Post vital signs: Reviewed and stable  Last Vitals:  Vitals Value Taken Time  BP 135/95 05/29/20 1415  Temp    Pulse 87 05/29/20 1416  Resp 13 05/29/20 1416  SpO2 100 % 05/29/20 1416  Vitals shown include unvalidated device data.  Last Pain:  Vitals:   05/29/20 1019  TempSrc: Temporal  PainSc: 0-No pain         Complications: No complications documented.

## 2020-05-29 NOTE — Progress Notes (Signed)
Pt voided 400 ml urine.  C/O urgency and unable to empty bladder.  Bladder scan 911 ml urine.  Assisted pt in standing to void.  Was able to void 200 ml.  Informed Dr. Tonna Boehringer.  Per Dr. Tonna Boehringer, Will continue to monitor pt.  Will bladder scan again and d/c pt with 200 ml  or less

## 2020-05-29 NOTE — Anesthesia Postprocedure Evaluation (Signed)
Anesthesia Post Note  Patient: Colan Laymon  Procedure(s) Performed: EXAM UNDER ANESTHESIA WITH HEMORRHOIDECTOMY (N/A Rectum)  Patient location during evaluation: PACU Anesthesia Type: General Level of consciousness: awake and alert and oriented Pain management: pain level controlled Vital Signs Assessment: post-procedure vital signs reviewed and stable Respiratory status: spontaneous breathing, nonlabored ventilation and respiratory function stable Cardiovascular status: blood pressure returned to baseline and stable Postop Assessment: no signs of nausea or vomiting Anesthetic complications: no   No complications documented.   Last Vitals:  Vitals:   05/29/20 1413 05/29/20 1415  BP: (!) 140/97 (!) 135/95  Pulse: 89 89  Resp: 11 12  Temp: 36.4 C   SpO2: 98% 100%    Last Pain:  Vitals:   05/29/20 1413  TempSrc:   PainSc: 0-No pain                 Arick Mareno

## 2020-05-29 NOTE — Discharge Instructions (Signed)
hemorrhoidectomy, Care After This sheet gives you information about how to care for yourself after your procedure. Your health care provider may also give you more specific instructions. If you have problems or questions, contact your health care provider. What can I expect after the procedure? After the procedure, it is common to have:  Soreness.  Bruising.  Itching. Follow these instructions at home: site care Follow instructions from your health care provider about how to take care of your site. Make sure you:  Wash your hands with soap and water before and after you change your bandage (dressing). If soap and water are not available, use hand sanitizer.  Leave stitches (sutures), skin glue, or adhesive strips in place. These skin closures may need to stay in place for 2 weeks or longer. If adhesive strip edges start to loosen and curl up, you may trim the loose edges. Do not remove adhesive strips completely unless your health care provider tells you to do that.  If the area bleeds or bruises, apply gentle pressure for 10 minutes.  OK TO SHOWER IN 24HRS  Check your site every day for signs of infection. Check for:  Redness, swelling, or pain.  Fluid or blood.  Warmth.  Pus or a bad smell.  General instructions  Rest and then return to your normal activities as told by your health care provider. .  tylenol and advil as needed for discomfort.  Please alternate between the two every four hours as needed for pain.   .  Use narcotics, if prescribed, only when tylenol and motrin is not enough to control pain. .  325-650mg  every 8hrs to max of 3000mg /24hrs (including the 325mg  in every norco dose) for the tylenol.   .  Advil up to 800mg  per dose every 8hrs as needed for pain.    Keep all follow-up visits as told by your health care provider. This is important. Contact a health care provider if:  You have redness, swelling, or pain around your site.  You have fluid or blood  coming from your site.  Your site feels warm to the touch.  You have pus or a bad smell coming from your site.  You have a fever.  Your sutures, skin glue, or adhesive strips loosen or come off sooner than expected. Get help right away if:  You have bleeding that does not stop with pressure or a dressing. Summary  After the procedure, it is common to have some soreness, bruising, and itching at the site.  Follow instructions from your health care provider about how to take care of your site.  Check your site every day for signs of infection.  Contact a health care provider if you have redness, swelling, or pain around your site, or your site feels warm to the touch.  Keep all follow-up visits as told by your health care provider. This is important. This information is not intended to replace advice given to you by your health care provider. Make sure you discuss any questions you have with your health care provider. Document Released: 03/20/2015 Document Revised: 08/21/2017 Document Reviewed: 08/21/2017 Elsevier Interactive Patient Education  2019 [employer]   AMBULATORY SURGERY  DISCHARGE INSTRUCTIONS   1) The drugs that you were given will stay in your system until tomorrow so for the next 24 hours you should not:  A) Drive an automobile B) Make any legal decisions C) Drink any alcoholic beverage   2) You may resume regular meals tomorrow.  Today  it is better to start with liquids and gradually work up to solid foods.  You may eat anything you prefer, but it is better to start with liquids, then soup and crackers, and gradually work up to solid foods.   3) Please notify your doctor immediately if you have any unusual bleeding, trouble breathing, redness and pain at the surgery site, drainage, fever, or pain not relieved by medication.    4) Additional Instructions:        Please contact your physician with any problems or Same Day Surgery at [phone],  Monday through Friday 6 am to 4 pm, or [place] at [facility] number at [phone]. Bupivacaine Liposomal Suspension for Injection What is this medicine? BUPIVACAINE LIPOSOMAL (bue PIV a kane LIP oh som al) is an anesthetic. It causes loss of feeling in the skin or other tissues. It is used to prevent and to treat pain from some procedures. This medicine may be used for other purposes; ask your health care provider or pharmacist if you have questions. COMMON BRAND NAME(S): EXPAREL What should I tell my health care provider before I take this medicine? They need to know if you have any of these conditions:  G6PD deficiency  heart disease  kidney disease  liver disease  low blood pressure  lung or breathing disease, like asthma  an unusual or allergic reaction to bupivacaine, other medicines, foods, dyes, or preservatives  pregnant or trying to get pregnant  breast-feeding How should I use this medicine? This medicine is injected into the affected area. It is given by a health care provider in a hospital or clinic setting. Talk to your health care provider about the use of this medicine in children. While it may be given to children as young as 6 years for selected conditions, precautions do apply. Overdosage: If you think you have taken too much of this medicine contact a poison control center or emergency room at once. NOTE: This medicine is only for you. Do not share this medicine with others. What if I miss a dose? This does not apply. What may interact with this medicine? This medicine may interact with the following medications:  acetaminophen  certain antibiotics like dapsone, nitrofurantoin, aminosalicylic acid, sulfonamides  certain medicines for seizures like phenobarbital, phenytoin, valproic acid  chloroquine  cyclophosphamide  flutamide  hydroxyurea  ifosfamide  metoclopramide  nitric oxide  nitroglycerin  nitroprusside  nitrous  oxide  other local anesthetics like lidocaine, pramoxine, tetracaine  primaquine  quinine  rasburicase  sulfasalazine This list may not describe all possible interactions. Give your health care provider a list of all the medicines, herbs, non-prescription drugs, or dietary supplements you use. Also tell them if you smoke, drink alcohol, or use illegal drugs. Some items may interact with your medicine. What should I watch for while using this medicine? Your condition will be monitored carefully while you are receiving this medicine. Be careful to avoid injury while the area is numb, and you are not aware of pain. What side effects may I notice from receiving this medicine? Side effects that you should report to your doctor or health care professional as soon as possible:  allergic reactions like skin rash, itching or hives, swelling of the face, lips, or tongue  seizures  signs and symptoms of a dangerous change in heartbeat or heart rhythm like chest pain; dizziness; fast, irregular heartbeat; palpitations; feeling faint or lightheaded; falls; breathing problems  signs and symptoms of methemoglobinemia such as pale, gray,  or blue colored skin; headache; fast heartbeat; shortness of breath; feeling faint or lightheaded, falls; tiredness Side effects that usually do not require medical attention (report to your doctor or health care professional if they continue or are bothersome):  anxious  back pain  changes in taste  changes in vision  constipation  dizziness  fever  nausea, vomiting This list may not describe all possible side effects. Call your doctor for medical advice about side effects. You may report side effects to FDA at 1-800-FDA-1088. Where should I keep my medicine? This drug is given in a hospital or clinic and will not be stored at home. NOTE: This sheet is a summary. It may not cover all possible information. If you have questions about this medicine, talk  to your doctor, pharmacist, or health care provider.  2021 Elsevier/Gold Standard (2019-05-30 12:24:57)

## 2020-05-30 ENCOUNTER — Encounter: Payer: Self-pay | Admitting: Surgery

## 2020-06-01 NOTE — Op Note (Signed)
Preoperative diagnosis: Fourth degree hemorrhoids.   Postoperative diagnosis: Fourth degree hemorrhoids.  Procedure: exam under anesthesia, fourth degree hemorrhoidectomy x3  Surgeon: Tonna Boehringer  Anesthesia: general  Specimen: hemorrhoids  Complications: none  EBL: 13mL  Wound classification: Clean Contaminated  Indications: Patient is a 30 y.o. male was found to have symptomatic hemorrhoids refractory to medical management.   Findings: 1.  Fourth degree hemorrhoids 2. Internal and external anal sphincter palpated and preserved 3. Adequate hemostasis  Description of procedure: The patient was brought to the operating room and general anesthesia was induced. Patient was placed high lithotomy position. A time-out was completed verifying correct patient, procedure, site, positioning, and implant(s) and/or special equipment prior to beginning this procedure. The perineum was prepped and draped in standard sterile fashion. Local anesthetic was injected as a perianal block.  Very large obvious fourth degree hemorrhoids were noted on external exam.  A harmonic was placed across the base of the left lateral pedicle and excess hemorrhoidal tissue removed.  Specimen was passed off operative field pending pathology.  3-0 Vicryl then used to close the open wound.  No sphincter muscle involvement was noted during the entire process  A harmonic was placed across the base of the right anterior pedicle and excess hemorrhoidal tissue removed.  Specimen was passed off operative field pending pathology.  3-0 Vicryl then used to close the open wound.No sphincter muscle involvement was noted during the entire process   A harmonic was placed across the base of the right posterior pedicle and excess hemorrhoidal tissue removed.  Specimen was passed off operative field pending pathology.  3-0 Vicryl then used to close the open wound.No sphincter muscle involvement was noted during the entire process  Hemostasis  achieved with additional 3-0 vicryl suture in areas of bleeding until all active bleeding controlled.  Last inspection of the anal canal did not note any additional hemorrhoidal tissue and no other pathology. Exparel injected as a perianal block. A gauze pad was tucked between the gluteal folds, and secured in place with mesh underwear.  The patient tolerated the procedure well and was taken to the postanesthesia care unit in stable condition.  Sponge and instrument count correct at end of procedure.

## 2020-06-02 LAB — SURGICAL PATHOLOGY

## 2022-07-17 ENCOUNTER — Emergency Department: Payer: Managed Care, Other (non HMO)

## 2022-07-17 ENCOUNTER — Emergency Department
Admission: EM | Admit: 2022-07-17 | Discharge: 2022-07-17 | Disposition: A | Discharge status: Home / Self Care | Payer: Managed Care, Other (non HMO) | Attending: Emergency Medicine | Admitting: Emergency Medicine

## 2022-07-17 ENCOUNTER — Other Ambulatory Visit: Payer: Self-pay

## 2022-07-17 DIAGNOSIS — S01511A Laceration without foreign body of lip, initial encounter: Secondary | ICD-10-CM | POA: Diagnosis not present

## 2022-07-17 DIAGNOSIS — J45909 Unspecified asthma, uncomplicated: Secondary | ICD-10-CM | POA: Insufficient documentation

## 2022-07-17 DIAGNOSIS — S0990XA Unspecified injury of head, initial encounter: Secondary | ICD-10-CM | POA: Diagnosis present

## 2022-07-17 DIAGNOSIS — Z23 Encounter for immunization: Secondary | ICD-10-CM | POA: Diagnosis not present

## 2022-07-17 DIAGNOSIS — Z8616 Personal history of COVID-19: Secondary | ICD-10-CM | POA: Diagnosis not present

## 2022-07-17 DIAGNOSIS — S066X0A Traumatic subarachnoid hemorrhage without loss of consciousness, initial encounter: Secondary | ICD-10-CM | POA: Diagnosis not present

## 2022-07-17 DIAGNOSIS — S065XAA Traumatic subdural hemorrhage with loss of consciousness status unknown, initial encounter: Secondary | ICD-10-CM

## 2022-07-17 DIAGNOSIS — S065X0A Traumatic subdural hemorrhage without loss of consciousness, initial encounter: Secondary | ICD-10-CM | POA: Insufficient documentation

## 2022-07-17 DIAGNOSIS — I609 Nontraumatic subarachnoid hemorrhage, unspecified: Secondary | ICD-10-CM

## 2022-07-17 DIAGNOSIS — S01411A Laceration without foreign body of right cheek and temporomandibular area, initial encounter: Secondary | ICD-10-CM | POA: Insufficient documentation

## 2022-07-17 LAB — CBC
HCT: 40.4 % (ref 39.0–52.0)
Hemoglobin: 12.3 g/dL — ABNORMAL LOW (ref 13.0–17.0)
MCH: 22.8 pg — ABNORMAL LOW (ref 26.0–34.0)
MCHC: 30.4 g/dL (ref 30.0–36.0)
MCV: 74.8 fL — ABNORMAL LOW (ref 80.0–100.0)
Platelets: 190 10*3/uL (ref 150–400)
RBC: 5.4 MIL/uL (ref 4.22–5.81)
RDW: 14.1 % (ref 11.5–15.5)
WBC: 6.1 10*3/uL (ref 4.0–10.5)
nRBC: 0 % (ref 0.0–0.2)

## 2022-07-17 LAB — BASIC METABOLIC PANEL
Anion gap: 12 (ref 5–15)
BUN: 13 mg/dL (ref 6–20)
CO2: 25 mmol/L (ref 22–32)
Calcium: 8.8 mg/dL — ABNORMAL LOW (ref 8.9–10.3)
Chloride: 106 mmol/L (ref 98–111)
Creatinine, Ser: 0.82 mg/dL (ref 0.61–1.24)
GFR, Estimated: >60 mL/min (ref >60)
Glucose, Bld: 124 mg/dL — ABNORMAL HIGH (ref 70–99)
Potassium: 3.3 mmol/L — ABNORMAL LOW (ref 3.5–5.1)
Sodium: 143 mmol/L (ref 135–145)

## 2022-07-17 LAB — ETHANOL: Alcohol, Ethyl (B): 270 mg/dL — ABNORMAL HIGH (ref <10)

## 2022-07-17 MED ORDER — LEVETIRACETAM 500 MG PO TABS
500.0000 mg | ORAL_TABLET | Freq: Once | ORAL | Status: AC
  Administered 2022-07-17: 500 mg via ORAL
  Filled 2022-07-17: qty 1

## 2022-07-17 MED ORDER — LEVETIRACETAM 500 MG PO TABS: 500.0000 mg | ORAL_TABLET | Freq: Two times a day (BID) | ORAL | 0 refills | Status: AC

## 2022-07-17 MED ORDER — TETANUS-DIPHTH-ACELL PERTUSSIS 5-2.5-18.5 LF-MCG/0.5 IM SUSY
0.5000 mL | PREFILLED_SYRINGE | Freq: Once | INTRAMUSCULAR | Status: AC
  Administered 2022-07-17: 0.5 mL via INTRAMUSCULAR
  Filled 2022-07-17: qty 0.5

## 2022-07-17 NOTE — ED Provider Notes (Signed)
-----------------------------------------   7:15 AM on 07/17/2022 -----------------------------------------  Blood pressure 119/86, pulse 80, temperature (!) 97.4 F (36.3 C), temperature source Axillary, resp. rate 17, height 6\' 4"  (1.93 m), weight 71.7 kg, SpO2 100 %.  Assuming care from Dr. Katrinka Blazing.  In short, Randall Phelps is a 32 y.o. male with a chief complaint of Facial Laceration and Loss of Consciousness .  Refer to the original H&P for additional details.  The current plan of care is to follow-up repeat head CT to ensure stability of small intracranial hemorrhage.  ----------------------------------------- 8:42 AM on 07/17/2022 ----------------------------------------- Repeat CT head stable compared to previous.  Patient now awake and alert, complains of some facial pain but declines any pain medication.  He is appropriate for discharge home with neurosurgery follow-up, has been prescribed 1 week of Keppra for seizure prophylaxis per Dr. Katrinka Blazing of neurosurgery.  Patient and spouse counseled to return to the ED for new or worsening symptoms, patient agrees with plan.   Chesley Noon, MD 07/17/22 [phone]

## 2022-07-17 NOTE — Consult Note (Signed)
Consulting Department:  Emergency department  Primary Physician:  Patient, No Pcp Per  Chief Complaint: Head trauma  History of Present Illness: 07/17/2022 Abiel Odeh is a 32 y.o. male who presents with the chief complaint of head trauma.  He is unable to recall how this happened however he does have significant facial trauma.  He has not noted any seizures.  No new weakness numbness or tingling.  He is currently inebriated than does have some difficulty with his history giving.   Review of Systems:  A 10 point review of systems is negative, except for the pertinent positives and negatives detailed in the HPI.  Past Medical History: Past Medical History:  Diagnosis Date   Anxiety    Asthma    childhood   COVID-19    Hemorrhoids     Past Surgical History: Past Surgical History:  Procedure Laterality Date   EVALUATION UNDER ANESTHESIA WITH HEMORRHOIDECTOMY N/A 05/29/2020   Procedure: EXAM UNDER ANESTHESIA WITH HEMORRHOIDECTOMY;  Surgeon: Sung Amabile, DO;  Location: [facility] ORS;  Service: General;  Laterality: N/A;    Allergies: Allergies as of 07/17/2022   (No Known Allergies)    Medications: No current facility-administered medications for this encounter.  Current Outpatient Medications:    levETIRAcetam (KEPPRA) 500 MG tablet, Take 1 tablet (500 mg total) by mouth 2 (two) times daily for 7 days., Disp: 14 tablet, Rfl: 0   HYDROcodone-acetaminophen (NORCO) 5-325 MG tablet, Take 1 tablet by mouth every 6 (six) hours as needed for up to 6 doses for moderate pain., Disp: 6 tablet, Rfl: 0   ibuprofen (ADVIL) 800 MG tablet, Take 1 tablet (800 mg total) by mouth every 8 (eight) hours as needed for mild pain or moderate pain., Disp: 30 tablet, Rfl: 0   lidocaine (XYLOCAINE) 5 % ointment, Apply 1 application topically 3 (three) times daily as needed for mild pain or moderate pain., Disp: 35.44 g, Rfl: 0   Social History: Social History   Tobacco Use   Smoking status: Some  Days   Smokeless tobacco: Never  Substance Use Topics   Alcohol use: Yes    Alcohol/week: 6.0 standard drinks of alcohol    Types: 6 Cans of beer per week    Comment: 3 x time week   Drug use: Not Currently    Family Medical History: History reviewed. No pertinent family history.  Physical Examination: Vitals:   07/17/22 0430 07/17/22 0856  BP: 119/86 127/80  Pulse: 80 89  Resp: 17 18  Temp:    SpO2: 100% 96%     General: Patient is well developed, well nourished, calm, collected, and in no apparent distress.  NEUROLOGICAL:  General: Patient still not fully cooperative with examination or history taking.  Does have evidence of facial trauma.  No evidence of Battle sign or raccoon eyes. He awakens but says that he does not want to talk.  He did not will open his eyes when prompted.  Opened his eyes once on his own to ask me to leave him alone.  His pupils are round and reactive.  No gross facial asymmetry noted.  No clear drainage from his nose or ears.  Face is symmetric.  He is moving all 4 extremities with good strength and no clear deficit, however not reproducibly to command.  Imaging: Narrative & Impression  CLINICAL DATA:  Follow-up [facility].   EXAM: CT HEAD WITHOUT CONTRAST   TECHNIQUE: Contiguous axial images were obtained from the base of the skull through the vertex  without intravenous contrast.   RADIATION DOSE REDUCTION: This exam was performed according to the departmental dose-optimization program which includes automated exposure control, adjustment of the mA and/or kV according to patient size and/or use of iterative reconstruction technique.   COMPARISON:  Earlier today   FINDINGS: Brain: Subarachnoid hemorrhage along the left cerebral convexity with possible subdural hematoma measuring up to 4 mm in thickness along the frontal parietal convexity as measured on 2:19. No midline shift or herniation. No brain edema or infarct. No hydrocephalus.    Vascular: No hyperdense vessel or unexpected calcification.   Skull: Normal. Negative for fracture or focal lesion.   Sinuses/Orbits: No acute finding.   IMPRESSION: No progression of extra-axial hemorrhage along the left cerebral convexity.     Electronically Signed   By: Tiburcio Pea M.D.   On: 07/17/2022 08:34    I have personally reviewed the images and agree with the above interpretation.  Labs:    Latest Ref Rng & Units 07/17/2022    2:10 AM  CBC  WBC 4.0 - 10.5 K/uL 6.1   Hemoglobin 13.0 - 17.0 g/dL 16.1   Hematocrit 09.6 - 52.0 % 40.4   Platelets 150 - 400 K/uL 190        Assessment and Plan: Mr. Sivers is a pleasant 32 y.o. male with unknown cause of trauma.  Has difficulty participating in the history, and states that he does not know how any of this happened.  His facial lacerations were repaired in the emergency department.  CT scan was given given his unknown history, a left-sided subdural/subarachnoid hemorrhage was noted without significant midline shift or hydrocephalus.  On physical examination he does not fully cooperate, however he does have strong bilateral upper and lower extremity motions with no clear deficit or asymmetry.  At this point there is no role for neurosurgical intervention.  We recommended a repeat head CT which did not demonstrate any expansion or worsening of the bleed.  At this point he should have Keppra 500 mg twice daily for 7 days.  He will likely want to follow-up with a TBI team.  Will arrange follow-up with a repeat head CT in our clinic in 4 to 6 weeks to evaluate for an expanding hygroma.   Lovenia Kim, MD/MSCR Dept. of Neurosurgery
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
## 2022-07-17 NOTE — ED Notes (Signed)
MD Jessup at bedside. Explained CT results and all findings to patient and wife again. Patient denied any other questions
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
## 2022-07-17 NOTE — ED Notes (Signed)
Pt asking where shirt and phone is again. Pt redirected he did not come in with these items. Pt asking where "his people" went. Pt informed he has not had visitors during this hospital stay. Pt alert but confused at this time
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
## 2022-07-17 NOTE — ED Notes (Signed)
MD Jessup at bedside explaining to wife and patient his test results

## 2022-07-17 NOTE — ED Notes (Addendum)
Family at bedside with patient. Family had patients phone and returned to patient at this time.
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
## 2022-07-17 NOTE — ED Notes (Signed)
Pt up out of bed looking for phone & shirt. Pt informed he did not come into hospital with shirt or phone. Pt currently has wallet and vape in possession. Pt offered hospital phone to call and check if someone has his phone- declines at this time. Pt assisted back to stretcher and placed back on monitor.

## 2022-07-17 NOTE — ED Notes (Signed)
MD at bedside updating family with patient permission

## 2022-07-17 NOTE — ED Notes (Signed)
MD Larinda Buttery offered pain medication to patient at this time. Patient denied needing anything for pain

## 2022-07-17 NOTE — ED Notes (Signed)
Patients 18G R AC IV removed. Everything intact. Patient tolerated well.   Patient given a scrub top due to not having a shirt.  Patient refused wheelchair offered by this RN at discharge

## 2023-05-23 ENCOUNTER — Ambulatory Visit
Admission: EM | Admit: 2023-05-23 | Discharge: 2023-05-23 | Disposition: A | Discharge status: Home / Self Care | Attending: Emergency Medicine | Admitting: Emergency Medicine

## 2023-05-23 DIAGNOSIS — L089 Local infection of the skin and subcutaneous tissue, unspecified: Secondary | ICD-10-CM | POA: Diagnosis not present

## 2023-05-23 DIAGNOSIS — S30810A Abrasion of lower back and pelvis, initial encounter: Secondary | ICD-10-CM

## 2023-05-23 MED ORDER — SILVER SULFADIAZINE 1 % EX CREA: TOPICAL_CREAM | Freq: Once | CUTANEOUS | Status: DC

## 2023-05-23 MED ORDER — DOXYCYCLINE HYCLATE 100 MG PO CAPS: 100.0000 mg | ORAL_CAPSULE | Freq: Two times a day (BID) | ORAL | 0 refills | Status: AC

## 2023-05-23 MED ORDER — SILVER SULFADIAZINE 1 % EX CREA: 1.0000 | TOPICAL_CREAM | Freq: Every day | CUTANEOUS | 0 refills | Status: AC
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
## 2023-05-23 NOTE — Discharge Instructions (Addendum)
 Take the doxycycline as directed.  Keep your wound clean and dry.  Wash it gently twice a day with soap and water.  Apply the Silvadene cream as directed.  Follow-up with your primary care provider.  Go to the emergency department if you have worsening symptoms.
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
